# Patient Record
Sex: Female | Born: 1995 | Marital: Single | Smoking: Current every day smoker
Health system: Southern US, Community
[De-identification: ages and names within clinical notes are randomized; demographics above are authoritative.]

---
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
## 2013-01-17 ENCOUNTER — Ambulatory Visit (INDEPENDENT_AMBULATORY_CARE_PROVIDER_SITE_OTHER): Payer: BC Managed Care – PPO | Admitting: Family Medicine

## 2013-01-17 ENCOUNTER — Encounter: Payer: Self-pay | Admitting: Family Medicine

## 2013-01-17 VITALS — BP 116/73 | HR 72 | Temp 99.4°F | Ht 69.0 in | Wt 133.8 lb

## 2013-01-17 DIAGNOSIS — R21 Rash and other nonspecific skin eruption: Secondary | ICD-10-CM

## 2013-01-17 DIAGNOSIS — R490 Dysphonia: Secondary | ICD-10-CM

## 2013-01-17 MED ORDER — METHYLPREDNISOLONE (PAK) 4 MG PO TABS: ORAL_TABLET | ORAL | Status: DC

## 2013-01-17 MED ORDER — METHYLPREDNISOLONE ACETATE 80 MG/ML IJ SUSP
80.0000 mg | Freq: Once | INTRAMUSCULAR | Status: AC
  Administered 2013-01-17: 80 mg via INTRAMUSCULAR

## 2013-01-17 NOTE — Progress Notes (Signed)
   Subjective:    Patient ID: Raven Campbell, female    DOB: 09-29-1995, 17 y.o.   MRN: [id number]  HPI This 17 y.o. female presents for evaluation of rash and pruritis.  She has hx of eczema.  She is having Some persistent hoarseness and states she would like a ENT consult to make sure she doesn't require Surgery.   Review of Systems C/o rash and hoarseness. No chest pain, SOB, HA, dizziness, vision change, N/V, diarrhea, constipation, dysuria, urinary urgency or frequency, myalgias, arthralgias or rash.     Objective:   Physical Exam Vital signs noted  Well developed well nourished female.  HEENT - Head atraumatic Normocephalic                Eyes - PERRLA, Conjuctiva - clear Sclera- Clear EOMI                Ears - EAC's Wnl TM's Wnl Gross Hearing WNL                 Throat - oropharanx wnl Respiratory - Lungs CTA bilateral Cardiac - RRR S1 and S2 without murmur GI - Abdomen soft Nontender and bowel sounds active x 4 Extremities - No edema. Neuro - Grossly intact. Skin - Rash over abdomen, chest, and back.      Assessment & Plan:  Hoarse - Plan: methylPREDNISolone acetate (DEPO-MEDROL) injection 80 mg, methylPREDNIsolone (MEDROL DOSPACK) 4 MG tablet, Ambulatory referral to ENT  Rash and nonspecific skin eruption - Plan: methylPREDNISolone acetate (DEPO-MEDROL) injection 80 mg, methylPREDNIsolone (MEDROL DOSPACK) 4 MG tablet, Ambulatory referral to ENT  Deatra Canter FNP

## 2013-02-05 ENCOUNTER — Encounter: Payer: Self-pay | Admitting: Nurse Practitioner

## 2013-02-05 ENCOUNTER — Ambulatory Visit (INDEPENDENT_AMBULATORY_CARE_PROVIDER_SITE_OTHER): Payer: BC Managed Care – PPO | Admitting: Nurse Practitioner

## 2013-02-05 VITALS — BP 122/76 | HR 93 | Temp 97.8°F | Ht 68.5 in | Wt 133.0 lb

## 2013-02-05 DIAGNOSIS — B360 Pityriasis versicolor: Secondary | ICD-10-CM

## 2013-02-05 DIAGNOSIS — N912 Amenorrhea, unspecified: Secondary | ICD-10-CM

## 2013-02-05 MED ORDER — MEDROXYPROGESTERONE ACETATE 10 MG PO TABS: 10.0000 mg | ORAL_TABLET | Freq: Every day | ORAL | Status: DC

## 2013-02-05 MED ORDER — LEVONORGEST-ETH ESTRAD 91-DAY 0.15-0.03 MG PO TABS: 1.0000 | ORAL_TABLET | Freq: Every day | ORAL | Status: DC

## 2013-02-05 NOTE — Progress Notes (Signed)
   Subjective:    Patient ID: Raven MorganWillow Campbell, female    DOB: 07/08/1995, 18 y.o.   MRN: [id number]  HPI Patient in wanting to stop the depoprovera because she doesn't have period when on shot nad she would like to have a period from time to time.    Review of Systems  All other systems reviewed and are negative.       Objective:   Physical Exam  Constitutional: She appears well-developed and well-nourished.  Cardiovascular: Normal rate, regular rhythm and normal heart sounds.   Pulmonary/Chest: Effort normal and breath sounds normal.  Skin:  Annular maculcular lesion varying sizes on abdomen and back.    BP 122/76  Pulse 93  Temp(Src) 97.8 F (36.6 C) (Oral)  Ht 5' 8.5" (1.74 m)  Wt 133 lb (60.328 kg)  BMI 19.93 kg/m2       Assessment & Plan:   1. Amenorrhea   2. Tinea versicolor    Meds ordered this encounter  Medications  . penicillin v potassium (VEETID) 500 MG tablet    Sig: Take 500 mg by mouth 4 (four) times daily.  Marland Kitchen. levonorgestrel-ethinyl estradiol (SEASONALE) 0.15-0.03 MG tablet    Sig: Take 1 tablet by mouth daily.    Dispense:  1 Package    Refill:  4    Order Specific Question:  Supervising Provider    Answer:  Ernestina PennaMOORE, DONALD W [[id number]]  . medroxyPROGESTERone (PROVERA) 10 MG tablet    Sig: Take 1 tablet (10 mg total) by mouth daily.    Dispense:  10 tablet    Refill:  0    Order Specific Question:  Supervising Provider    Answer:  Ernestina PennaMOORE, DONALD W [[id number]]   Discussed how and when to start birthcontrol pills Follow up prn Discussed care of tinea versicolor  Raven Daphine DeutscherMartin, FNP

## 2013-02-05 NOTE — Patient Instructions (Addendum)
Must have period before starting birth control pills- Tinea Versicolor Tinea versicolor is a common yeast infection of the skin. This condition becomes known when the yeast on our skin starts to overgrow (yeast is a normal inhabitant on our skin). This condition is noticed as white or light Jenkinson patches on [place] skin, and is more evident in the summer on tanned skin. These areas are slightly scaly if scratched. The light patches from the yeast become evident when the yeast creates "holes in your suntan". This is most often noticed in the summer. The patches are usually located on the chest, back, pubis, neck and body folds. However, it may occur on any area of body. Mild itching and inflammation (redness or soreness) may be present. DIAGNOSIS  The diagnosisof this is made clinically (by looking). Cultures from samples are usually not needed. Examination under the microscope may help. However, yeast is normally found on skin. The diagnosis still remains clinical. Examination under Wood's Ultraviolet Light can determine the extent of the infection. TREATMENT  This common infection is usually only of cosmetic (only a concern to your appearance). It is easily treated with dandruff shampoo used during showers or bathing. Vigorous scrubbing will eliminate the yeast over several days time. The light areas in your skin may remain for weeks or months after the infection is cured unless your skin is exposed to sunlight. The lighter or darker spots caused by the fungus that remain after complete treatment are not a sign of treatment failure; it will take a long time to resolve. Your caregiver may recommend a number of commercial preparations or medication by mouth if home care is not working. Recurrence is common and preventative medication may be necessary. This skin condition is not highly contagious. Special care is not needed to protect close friends and family members. Normal hygiene is usually enough. Follow up is  required only if you develop complications (such as a secondary infection from scratching), if recommended by your caregiver, or if no relief is obtained from the preparations used. Document Released: 01/16/2000 Document Revised: 04/12/2011 Document Reviewed: 02/28/2008 [facility] Patient Information 2014 [employer] [place].

## 2013-04-05 ENCOUNTER — Telehealth: Payer: Self-pay | Admitting: Nurse Practitioner

## 2013-04-05 NOTE — Telephone Encounter (Signed)
appt given  

## 2013-04-10 ENCOUNTER — Ambulatory Visit (INDEPENDENT_AMBULATORY_CARE_PROVIDER_SITE_OTHER): Payer: BC Managed Care – PPO | Admitting: Nurse Practitioner

## 2013-04-10 ENCOUNTER — Encounter: Payer: Self-pay | Admitting: Nurse Practitioner

## 2013-04-10 VITALS — BP 106/63 | HR 71 | Temp 97.9°F | Ht 68.0 in | Wt 144.0 lb

## 2013-04-10 DIAGNOSIS — B079 Viral wart, unspecified: Secondary | ICD-10-CM

## 2013-04-10 DIAGNOSIS — B078 Other viral warts: Secondary | ICD-10-CM

## 2013-04-10 DIAGNOSIS — L80 Vitiligo: Secondary | ICD-10-CM

## 2013-04-10 MED ORDER — BETAMETHASONE DIPROPIONATE AUG 0.05 % EX LOTN: TOPICAL_LOTION | Freq: Two times a day (BID) | CUTANEOUS | Status: AC

## 2013-04-10 NOTE — Progress Notes (Addendum)
   Subjective:    Patient ID: Raven MorganWillow Campbell, female    DOB: 08/14/1995, 18 y.o.   MRN: [id number]  HPI Patient presents today for wart removal. Warts are on ant and post right knee  * vitiligo on abdominal wall- has been treating with dandruff shampoo for 2 months not completely gone.  Review of Systems  Constitutional: Negative.   HENT: Negative.   Respiratory: Negative.   Cardiovascular: Negative.   Gastrointestinal: Negative.   Musculoskeletal: Negative.   Skin: Positive for rash.  All other systems reviewed and are negative.       Objective:   Physical Exam  Constitutional: She appears well-developed and well-nourished.  Cardiovascular: Normal rate, regular rhythm and normal heart sounds.   Pulmonary/Chest: Effort normal and breath sounds normal.  Abdominal: Soft. Bowel sounds are normal.  Skin:  Macular ares on abdominal wall with loss of pigmentation.  2cm flesh colored lesion right ant knee 2sm flesh colored lesion right post knee  Psychiatric: She has a normal mood and affect. Her behavior is normal. Judgment and thought content normal.   BP 106/63  Pulse 71  Temp(Src) 97.9 F (36.6 C) (Oral)  Ht 5\' 8"  (1.727 m)  Wt 144 lb (65.318 kg)  BMI 21.90 kg/m2  Cryotherapy of verruca      Assessment & Plan:   1. Verrucas   2. Vitiligo    Meds ordered this encounter  Medications  . betamethasone, augmented, (DIPROLENE) 0.05 % lotion    Sig: Apply topically 2 (two) times daily.    Dispense:  60 mL    Refill:  0    Order Specific Question:  Supervising Provider    Answer:  Raven Campbell [[id number]]   Mix steroid cream with eucerin cream and apply bid to affected area. Do not pick or scratch at wart lesions- watch for signs of infection RTO prn  Raven Daphine DeutscherMartin, FNP

## 2013-04-10 NOTE — Patient Instructions (Signed)
Cryotherapy Cryotherapy means treatment with cold. Ice or gel packs can be used to reduce both pain and swelling. Ice is the most helpful within the first 24 to 48 hours after an injury or flareup from overusing a muscle or joint. Sprains, strains, spasms, burning pain, shooting pain, and aches can all be eased with ice. Ice can also be used when recovering from surgery. Ice is effective, has very few side effects, and is safe for most people to use. PRECAUTIONS  Ice is not a safe treatment option for people with:  Raynaud's phenomenon. This is a condition affecting small blood vessels in the extremities. Exposure to cold may cause your problems to return.  Cold hypersensitivity. There are many forms of cold hypersensitivity, including:  Cold urticaria. Red, itchy hives appear on the skin when the tissues begin to warm after being iced.  Cold erythema. This is a red, itchy rash caused by exposure to cold.  Cold hemoglobinuria. Red blood cells break down when the tissues begin to warm after being iced. The hemoglobin that carry oxygen are passed into the urine because they cannot combine with blood proteins fast enough.  Numbness or altered sensitivity in the area being iced. If you have any of the following conditions, do not use ice until you have discussed cryotherapy with your caregiver:  Heart conditions, such as arrhythmia, angina, or chronic heart disease.  High blood pressure.  Healing wounds or open skin in the area being iced.  Current infections.  Rheumatoid arthritis.  Poor circulation.  Diabetes. Ice slows the blood flow in the region it is applied. This is beneficial when trying to stop inflamed tissues from spreading irritating chemicals to surrounding tissues. However, if you expose your skin to cold temperatures for too long or without the proper protection, you can damage your skin or nerves. Watch for signs of skin damage due to cold. HOME CARE INSTRUCTIONS Follow  these tips to use ice and cold packs safely.  Place a dry or damp towel between the ice and skin. A damp towel will cool the skin more quickly, so you may need to shorten the time that the ice is used.  For a more rapid response, add gentle compression to the ice.  Ice for no more than 10 to 20 minutes at a time. The bonier the area you are icing, the less time it will take to get the benefits of ice.  Check your skin after 5 minutes to make sure there are no signs of a poor response to cold or skin damage.  Rest 20 minutes or more in between uses.  Once your skin is numb, you can end your treatment. You can test numbness by very lightly touching your skin. The touch should be so light that you do not see the skin dimple from the pressure of your fingertip. When using ice, most people will feel these normal sensations in this order: cold, burning, aching, and numbness.  Do not use ice on someone who cannot communicate their responses to pain, such as small children or people with dementia. HOW TO MAKE AN ICE PACK Ice packs are the most common way to use ice therapy. Other methods include ice massage, ice baths, and cryo-sprays. Muscle creams that cause a cold, tingly feeling do not offer the same benefits that ice offers and should not be used as a substitute unless recommended by your caregiver. To make an ice pack, do one of the following:  Place crushed ice or   a bag of frozen vegetables in a sealable plastic bag. Squeeze out the excess air. Place this bag inside another plastic bag. Slide the bag into a pillowcase or place a damp towel between your skin and the bag.  Mix 3 parts water with 1 part rubbing alcohol. Freeze the mixture in a sealable plastic bag. When you remove the mixture from the freezer, it will be slushy. Squeeze out the excess air. Place this bag inside another plastic bag. Slide the bag into a pillowcase or place a damp towel between your skin and the bag. SEEK MEDICAL  CARE IF:  You develop white spots on your skin. This may give the skin a blotchy (mottled) appearance.  Your skin turns blue or pale.  Your skin becomes waxy or hard.  Your swelling gets worse. MAKE SURE YOU:   Understand these instructions.  Will watch your condition.  Will get help right away if you are not doing well or get worse. Document Released: 09/14/2010 Document Revised: 04/12/2011 Document Reviewed: 09/14/2010 [employer] Patient Information 2014 [employer].  

## 2013-12-11 IMAGING — CT CT ABD-PELV W/ CM
2 of 4 series · 17 of 46 positions shown, 19 images · IV contrast (APPLIED)
Comparison: Plain films earlier today.

CLINICAL DATA: Severe lower abdominal pain with nausea and vomiting
since this morning.

CT ABDOMEN AND PELVIS WITH CONTRAST
TECHNIQUE: Multidetector CT imaging of the abdomen and pelvis was
performed following the standard protocol during bolus
administration of intravenous contrast.
Contrast: 80mL OMNIPAQUE IOHEXOL 300 MG/ML  SOLN

[Series 2: abd/pelv with 5.0 b31f st · axial · 0.62mm/px · z∈[+717,+1142]mm · 14 of 93 slices shown, 16 images]
[im 4/93  soft-tissue]
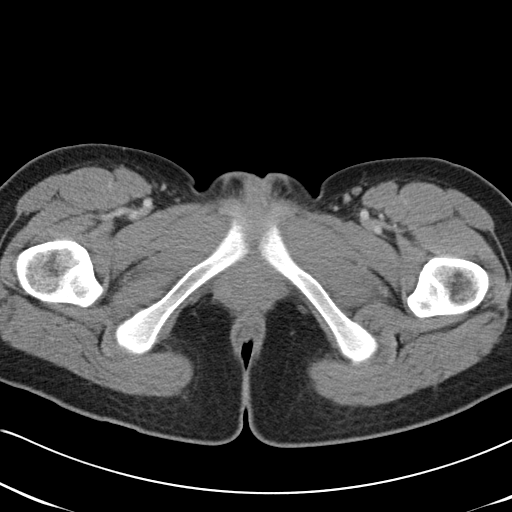
[im 4/93  bone]
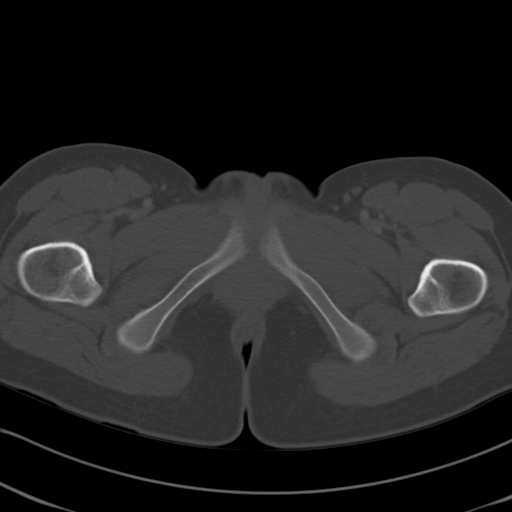
[im 12/93  soft-tissue]
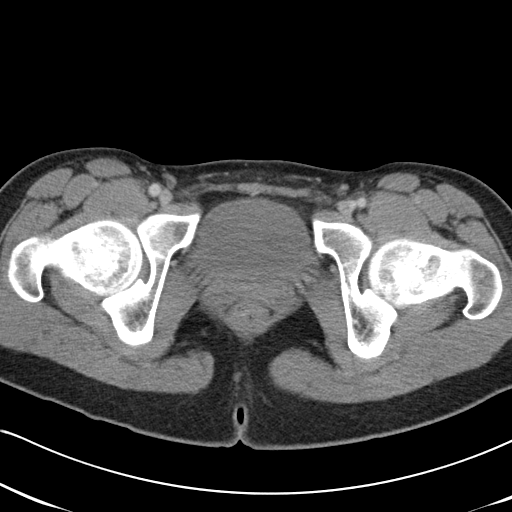
[im 19/93  soft-tissue]
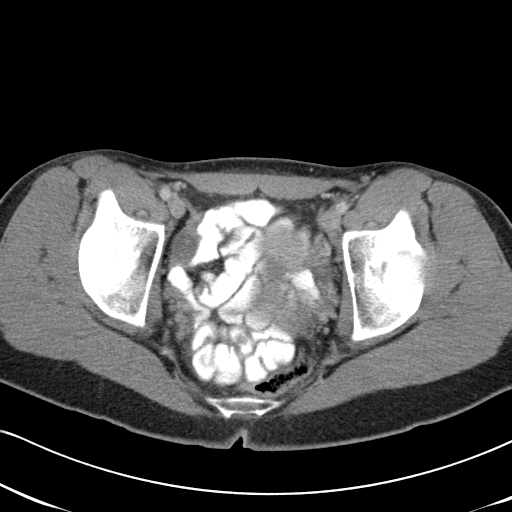
[im 26/93  soft-tissue]
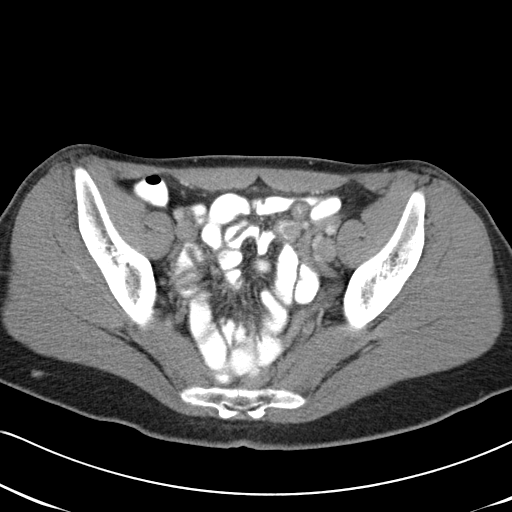
[im 30/93  soft-tissue]
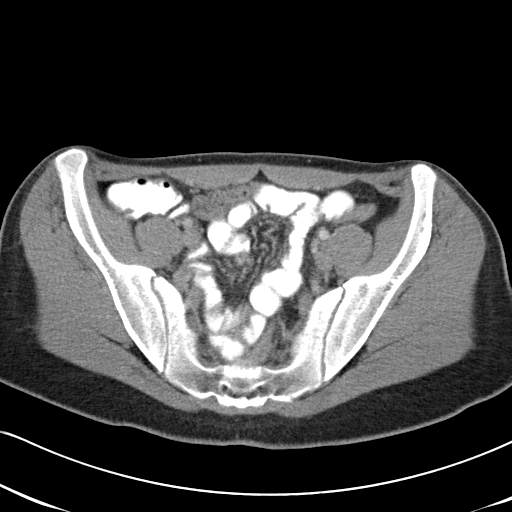
[im 37/93  soft-tissue]
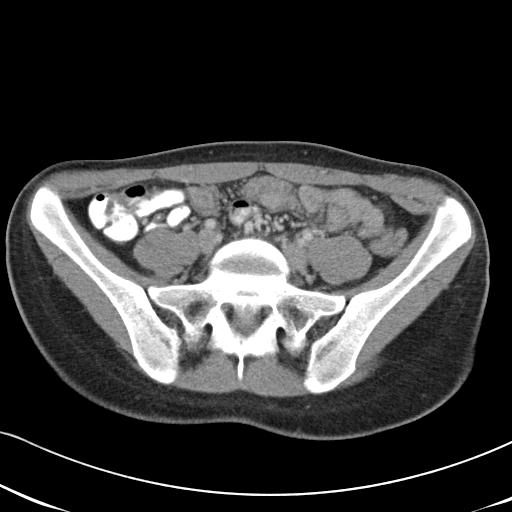
[im 45/93  soft-tissue]
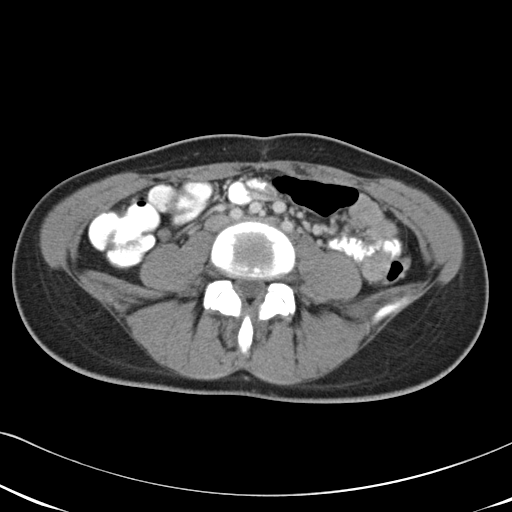
[im 48/93  soft-tissue]
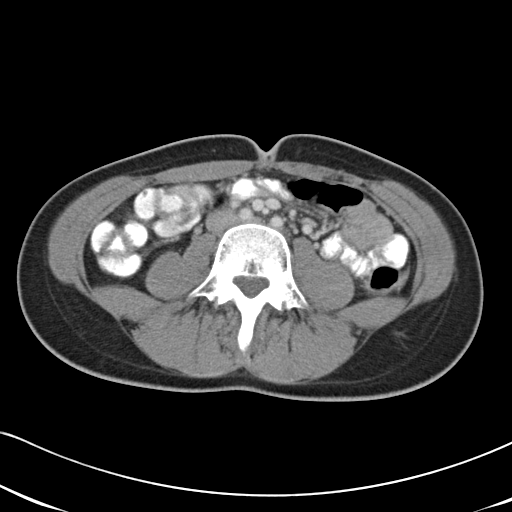
[im 56/93  soft-tissue]
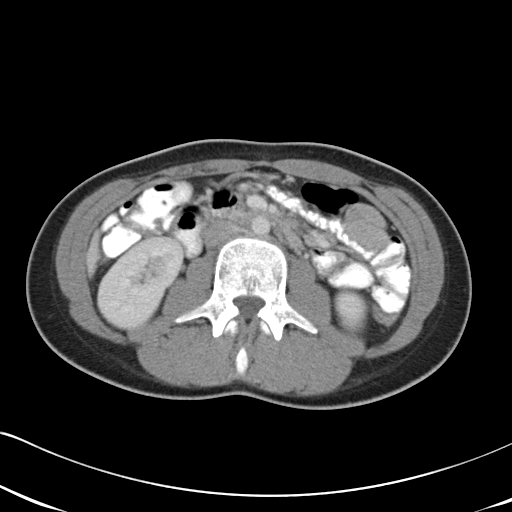
[im 56/93  bone]
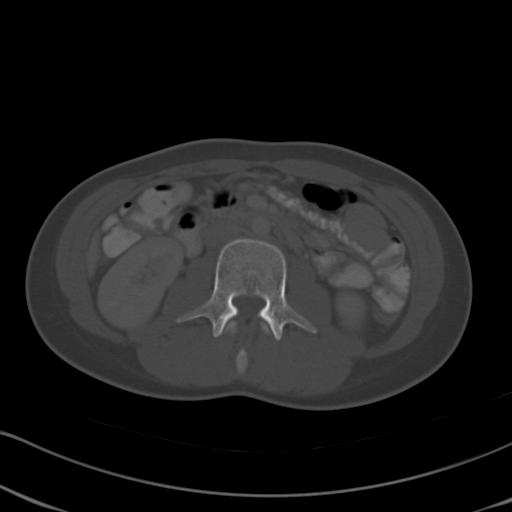
[im 63/93  soft-tissue]
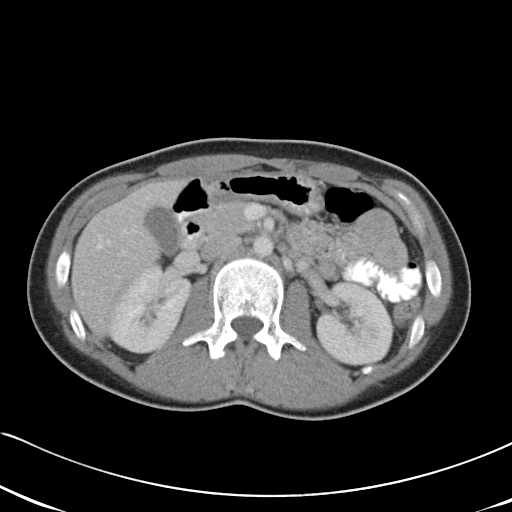
[im 70/93  soft-tissue]
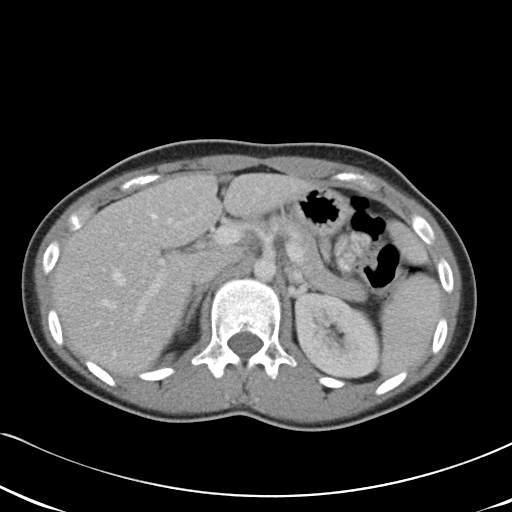
[im 74/93  soft-tissue]
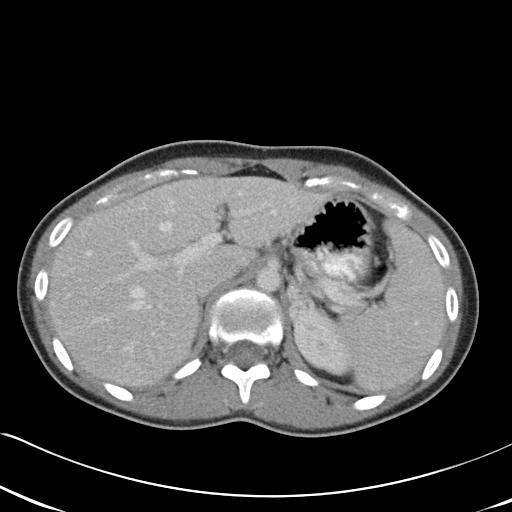
[im 81/93  soft-tissue]
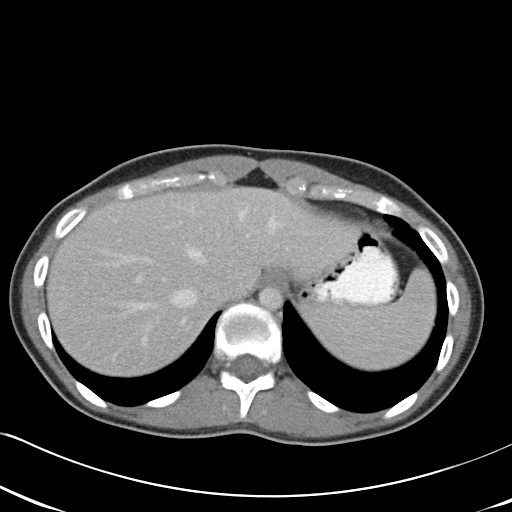
[im 89/93  soft-tissue]
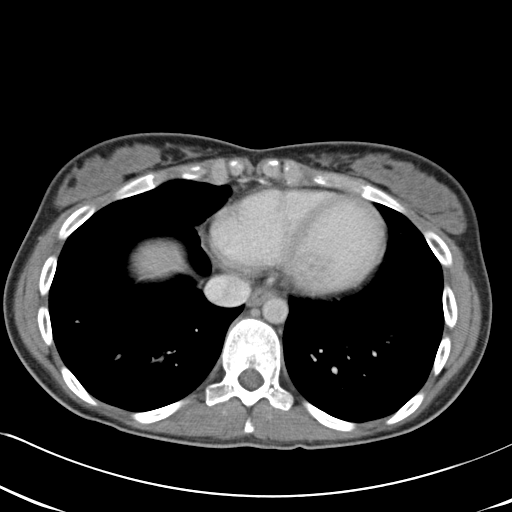

[Series 602: cor · coronal · 0.91mm/px · 3 of 96 slices shown]
[im 32/96  soft-tissue]
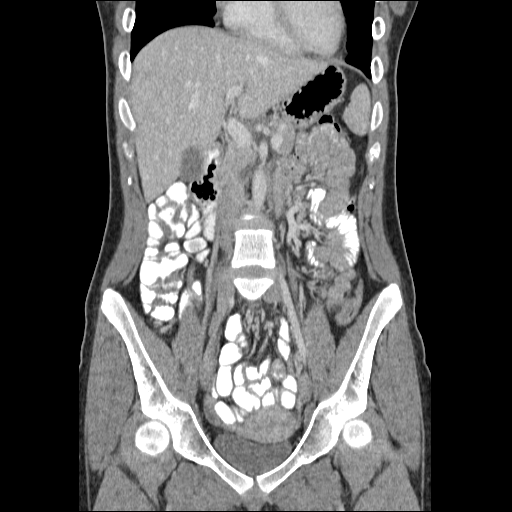
[im 43/96  soft-tissue]
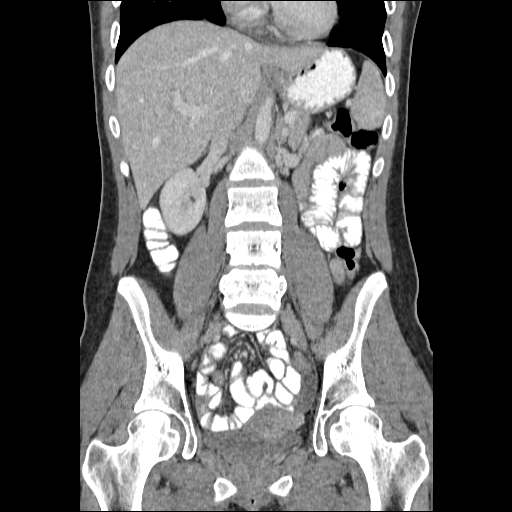
[im 53/96  soft-tissue]
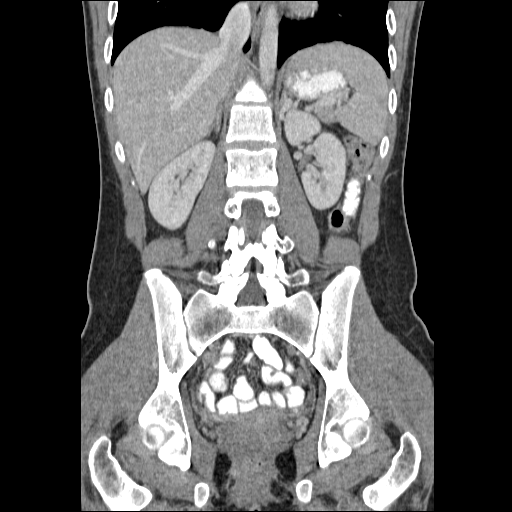

[17 of 46 positions shown; findings below may reference images not displayed]

FINDINGS: Lung bases:  Clear lung bases.  Normal heart size without
pericardial or pleural effusion.

Abdomen/pelvis:  Mild hepatic steatosis suspected.  Normal spleen,
stomach, pancreas, gallbladder, biliary tract, adrenal glands,
kidneys.

 No retroperitoneal or retrocrural adenopathy. Normal colon and
terminal ileum.  The appendix is well visualized.  Its tip measures
up to 7 mm on coronal image 34.  Upper normal 8 mm.  There is no
evidence of surrounding inflammation.  The more proximal/inferior
appendix is normal in appearance.

Normal small bowel without abdominal ascites.

  No pelvic adenopathy.  Normal urinary bladder and uterus, without
adnexal mass or significant free pelvic fluid.

 Bones/Musculoskeletal:  No acute osseous abnormality.
IMPRESSION: 1.  No evidence of appendicitis, and no other explanation for
patient's symptoms.
2.  Suspicion of mild hepatic steatosis.

## 2014-02-10 ENCOUNTER — Other Ambulatory Visit: Payer: Self-pay | Admitting: Nurse Practitioner

## 2014-04-16 ENCOUNTER — Other Ambulatory Visit: Payer: Self-pay | Admitting: Nurse Practitioner

## 2014-04-16 ENCOUNTER — Telehealth: Payer: Self-pay

## 2014-04-16 DIAGNOSIS — R499 Unspecified voice and resonance disorder: Secondary | ICD-10-CM

## 2014-04-16 MED ORDER — MEDROXYPROGESTERONE ACETATE 150 MG/ML IM SUSP: 150.0000 mg | INTRAMUSCULAR | Status: AC

## 2014-04-16 NOTE — Telephone Encounter (Signed)
Pt would like to change her birth control medicine and would also like a referral [place] She was given one before,but failed to go to appointment. Dx : voice changing  She can be reached [phone]

## 2014-04-16 NOTE — Telephone Encounter (Signed)
Why does she need to change birth control- does she know to what Referral made

## 2014-04-16 NOTE — Telephone Encounter (Signed)
Patient is having a hard time remembering to take it regularly so she would like to go back on depo provera. You can send the script to [facility] [place]. It is set as the default pharmacy.

## 2014-04-23 ENCOUNTER — Telehealth: Payer: Self-pay

## 2014-04-23 ENCOUNTER — Telehealth: Payer: Self-pay | Admitting: Nurse Practitioner

## 2014-04-23 ENCOUNTER — Encounter: Payer: Self-pay | Admitting: Nurse Practitioner

## 2014-04-23 NOTE — Telephone Encounter (Signed)
This has already been done.

## 2014-04-23 NOTE — Telephone Encounter (Signed)
Patient aware that letter has been faxed.

## 2014-04-23 NOTE — Telephone Encounter (Signed)
Has her Rx you gave her for Depo Shot  [facility] but they need a note faxed that you did give her this RX and that it's OK for her to get it there  Fax # [phone]   Has an appt. Today at 3:00
